# Patient Record
Sex: Male | Born: 1955 | Race: White | Hispanic: No | Marital: Married | Smoking: Never smoker
Health system: Southern US, Community
[De-identification: ages and names within clinical notes are randomized; demographics above are authoritative.]

## PROBLEM LIST (undated history)

## (undated) DIAGNOSIS — G7000 Myasthenia gravis without (acute) exacerbation: Secondary | ICD-10-CM

## (undated) HISTORY — PX: HERNIA REPAIR: SHX51

## (undated) HISTORY — PX: WISDOM TOOTH EXTRACTION: SHX21

## (undated) HISTORY — PX: KNEE SURGERY: SHX244

---

## 2009-07-24 ENCOUNTER — Ambulatory Visit: Payer: Self-pay | Admitting: Family Medicine

## 2009-07-24 DIAGNOSIS — L255 Unspecified contact dermatitis due to plants, except food: Secondary | ICD-10-CM

## 2009-07-25 ENCOUNTER — Telehealth (INDEPENDENT_AMBULATORY_CARE_PROVIDER_SITE_OTHER): Payer: Self-pay

## 2010-04-08 NOTE — Progress Notes (Signed)
  Phone Note Call from Patient   Caller: Patient Summary of Call: Patient called back and wanted to speak to the doctor about the medication dosage. Dr. Thurmond Butts told patient to take medicine one way and the actual prescription was written differently. Please contact the patient at [phone]. Nh  Initial call taken by: Dannette Barbara,  Jul 25, 2009 8:37 AM      Per physician review, pt advised to take medication as prescribed. Areta Haber CMA  Jul 25, 2009 11:42 AM

## 2010-04-08 NOTE — Assessment & Plan Note (Signed)
Summary: COUGH/KH   Vital Signs:  Patient Profile:   55 Years Old Male CC:      Poison Ivy on back x 2 days, Cough x 6 weeks Height:     66 inches Weight:      183 pounds O2 Sat:      98 % O2 treatment:    Room Air Temp:     97.0 degrees F oral Pulse rate:   65 / minute Pulse rhythm:   regular Resp:     16 per minute BP sitting:   115 / 77  (right arm) Cuff size:   large  Vitals Entered By: Emilio Math (Jul 24, 2009 12:03 PM)                  Prior Medication List:  No prior medications documented  Current Allergies: No known allergies History of Present Illness Chief Complaint: Poison Ivy on back x 2 days, Cough x 6 weeks History of Present Illness: Poisin ivy for 2 days. He reports when he gets its  its really bad.  He reports that when he gets it it can be various parts of his body. He has had the cough for 6-8 weeks.He does not relate it to position but reports dairy products make it worse.   Current Problems: CONTACT DERMATITIS&OTHER ECZEMA DUE TO PLANTS (ICD-692.6) COUGH (ICD-786.2)   Current Meds TUSSIONEX PENNKINETIC ER 8-10 MG/5ML LQCR (CHLORPHENIRAMINE-HYDROCODONE) sig  1 tsp by mouth twice a day prn for cough if affordable or covered by insurance RANITIDINE HCL 300 MG TABS (RANITIDINE HCL) 1 by mouth at bedtime * PREDNISONE DOSE PACK 10 MGS sig 6 tabletsx 2 days, 5 tablets day 3&4, 4 tablets day 5&6, 3 tablets day 7&8, 2 tablets day 9&10, 1 tablet day 11&12, 1/2 tablet day 13 and 14  REVIEW OF SYSTEMS Constitutional Symptoms      Denies fever, chills, night sweats, weight loss, weight gain, and fatigue.  Eyes       Denies change in vision, eye pain, eye discharge, glasses, contact lenses, and eye surgery. Ear/Nose/Throat/Mouth       Denies hearing loss/aids, change in hearing, ear pain, ear discharge, dizziness, frequent runny nose, frequent nose bleeds, sinus problems, sore throat, hoarseness, and tooth pain or bleeding.  Respiratory       Complains  of productive cough.      Denies dry cough, wheezing, shortness of breath, asthma, bronchitis, and emphysema/COPD.  Cardiovascular       Denies murmurs, chest pain, and tires easily with exhertion.    Gastrointestinal       Denies stomach pain, nausea/vomiting, diarrhea, constipation, blood in bowel movements, and indigestion. Genitourniary       Denies painful urination, kidney stones, and loss of urinary control. Neurological       Denies paralysis, seizures, and fainting/blackouts. Musculoskeletal       Denies muscle pain, joint pain, joint stiffness, decreased range of motion, redness, swelling, muscle weakness, and gout.  Skin       Denies bruising, unusual mles/lumps or sores, and hair/skin or nail changes.  Psych       Denies mood changes, temper/anger issues, anxiety/stress, speech problems, depression, and sleep problems.  Past History:  Family History: Last updated: 07/24/2009 Mother, D, Breast CA Father, D, Alzheimers  Social History: Last updated: 07/24/2009 Non-smoker ETOH-yes No Drugs retired  Past Medical History: Unremarkable  Past Surgical History: Left knee 1976, 1986  Family History: Reviewed history and no changes required.  Mother, D, Breast CA Father, D, Alzheimers  Social History: Reviewed history and no changes required. Non-smoker ETOH-yes No Drugs retired Physical Exam General appearance: well developed, well nourished, no acute distress Head: normocephalic, atraumatic Chest/Lungs: no rales, wheezes, or rhonchi bilateral, breath sounds equal without effort Heart: regular rate and  rhythm, no murmur Neurological: grossly intact and non-focal Skin: weeping and oozing rash over the R lower back  MSE: oriented to time, place, and person Assessment Problems:   New Problems: CONTACT DERMATITIS&OTHER ECZEMA DUE TO PLANTS (ICD-692.6) COUGH (ICD-786.2)  cough  poisin ivy    food allery versus reflux  Patient Education: Patient and/or  caregiver instructed in the following: rest fluids and Tylenol.  Plan New Medications/Changes: Sandria Senter ER 8-10 MG/5ML LQCR (CHLORPHENIRAMINE-HYDROCODONE) sig  1 tsp by mouth twice a day prn for cough if affordable or covered by insurance  #60fl oz x 0, 07/24/2009, Hassan Rowan MD PREDNISONE DOSE PACK 10 MGS sig 6 tabletsx 2 days, 5 tablets day 3&4, 4 tablets day 5&6, 3 tablets day 7&8, 2 tablets day 9&10, 1 tablet day 11&12, 1/2 tablet day 13 and 14  #QS x 0, 07/24/2009, Hassan Rowan MD RANITIDINE HCL 300 MG TABS (RANITIDINE HCL) 1 by mouth at bedtime  #30 x 0, 07/24/2009, Hassan Rowan MD  New Orders: New Patient Level IV [31517] Solumedrol up to 125mg  [J2930] T-Chest x-ray, 2 views [71020] Admin of Therapeutic Inj  intramuscular or subcutaneous [96372] Planning Comments:   see below  Follow Up: Follow up in 2-3 days if no improvement, Follow up with Primary Physician  The patient and/or caregiver has been counseled thoroughly with regard to medications prescribed including dosage, schedule, interactions, rationale for use, and possible side effects and they verbalize understanding.  Diagnoses and expected course of recovery discussed and will return if not improved as expected or if the condition worsens. Patient and/or caregiver verbalized understanding.  Prescriptions: TUSSIONEX PENNKINETIC ER 8-10 MG/5ML LQCR (CHLORPHENIRAMINE-HYDROCODONE) sig  1 tsp by mouth twice a day prn for cough if affordable or covered by insurance  #51fl oz x 0   Entered and Authorized by:   Hassan Rowan MD   Signed by:   Hassan Rowan MD on 07/24/2009   Method used:   Print then Give to Patient   RxID:   [id number] PREDNISONE DOSE PACK 10 MGS sig 6 tabletsx 2 days, 5 tablets day 3&4, 4 tablets day 5&6, 3 tablets day 7&8, 2 tablets day 9&10, 1 tablet day 11&12, 1/2 tablet day 13 and 14  #QS x 0   Entered and Authorized by:   Hassan Rowan MD   Signed by:   Hassan Rowan MD on 07/24/2009   Method used:    Print then Give to Patient   RxID:   [id number] RANITIDINE HCL 300 MG TABS (RANITIDINE HCL) 1 by mouth at bedtime  #30 x 0   Entered and Authorized by:   Hassan Rowan MD   Signed by:   Hassan Rowan MD on 07/24/2009   Method used:   Print then Give to Patient   RxID:   [id number]   Patient Instructions: 1)  Please schedule an appointment with your primary doctor in : 2)   schedule a follow-up appointment in 2 weeks if not better. 3)  Cough may be related to food allergy or position of sleep if reflux is the cause. Do not eat within 4 hrs of going to bed and sleep iw/head,neck and chestelevated to minimize reflux. 4)  If  food allergy or seasonal allergies the steroid use for the contact dermatitis should also help. 5)  Please schedule a follow-up appointment as needed.   Medication Administration  Injection # 1:    Medication: Solumedrol up to 125mg     Diagnosis: CONTACT DERMATITIS&OTHER ECZEMA DUE TO PLANTS (ICD-692.6)    Route: IM    Site: RUOQ gluteus    Exp Date: 01/08/2012    Lot #: [id number]    Mfr: Pharmacia    Given by: Emilio Math (Jul 24, 2009 12:53 PM)  Orders Added: 1)  New Patient Level IV [99204] 2)  Solumedrol up to 125mg  [J2930] 3)  T-Chest x-ray, 2 views [71020] 4)  Admin of Therapeutic Inj  intramuscular or subcutaneous [91478]

## 2010-04-28 ENCOUNTER — Ambulatory Visit (INDEPENDENT_AMBULATORY_CARE_PROVIDER_SITE_OTHER): Payer: BC Managed Care – PPO | Admitting: Emergency Medicine

## 2010-04-28 ENCOUNTER — Encounter: Payer: Self-pay | Admitting: Emergency Medicine

## 2010-04-28 DIAGNOSIS — L255 Unspecified contact dermatitis due to plants, except food: Secondary | ICD-10-CM

## 2010-05-06 NOTE — Assessment & Plan Note (Signed)
Summary: POSION IVY (rm 4)   Vital Signs:  Patient Profile:   55 Years Old Male CC:      poison ivy to both arms and face x 2 days Height:     66 inches Weight:      178 pounds O2 Sat:      96 % O2 treatment:    Room Air Temp:     99.0 degrees F oral Pulse rate:   72 / minute Resp:     14 per minute BP sitting:   103 / 68  (left arm) Cuff size:   regular  Vitals Entered By: Lajean Saver RN (April 28, 2010 10:42 AM)                  Updated Prior Medication List: No Medications Current Allergies: No known allergies History of Present Illness History from: patient Chief Complaint: poison ivy to both arms and face x 2 days History of Present Illness: Was clearing weeds and branches in his yard a few days ago and got in contact with vines.  Now is breaking out in a red itchy rash on both forearms.  Using OTC soaps to dry it out which is helping a bit.  He has used Mometasone in the past for poison ivy which works well.  No SOB.  REVIEW OF SYSTEMS Constitutional Symptoms      Denies fever, chills, night sweats, weight loss, weight gain, and fatigue.  Eyes       Denies change in vision, eye pain, eye discharge, glasses, contact lenses, and eye surgery. Ear/Nose/Throat/Mouth       Denies hearing loss/aids, change in hearing, ear pain, ear discharge, dizziness, frequent runny nose, frequent nose bleeds, sinus problems, sore throat, hoarseness, and tooth pain or bleeding.  Respiratory       Denies dry cough, productive cough, wheezing, shortness of breath, asthma, bronchitis, and emphysema/COPD.  Cardiovascular       Denies murmurs, chest pain, and tires easily with exhertion.    Gastrointestinal       Denies stomach pain, nausea/vomiting, diarrhea, constipation, blood in bowel movements, and indigestion. Genitourniary       Denies painful urination, kidney stones, and loss of urinary control. Neurological       Denies paralysis, seizures, and  fainting/blackouts. Musculoskeletal       Denies muscle pain, joint pain, joint stiffness, decreased range of motion, redness, swelling, muscle weakness, and gout.  Skin       Denies bruising, unusual mles/lumps or sores, and hair/skin or nail changes.  Psych       Denies mood changes, temper/anger issues, anxiety/stress, speech problems, depression, and sleep problems. Other Comments: ?poison ivy to both arms and left side of face since saturday. Leaking   Past History:  Past Medical History: Reviewed history from 07/24/2009 and no changes required. Unremarkable  Past Surgical History: Reviewed history from 07/24/2009 and no changes required. Left knee 1976, 1986  Family History: Reviewed history from 07/24/2009 and no changes required. Mother, D, Breast CA Father, D, Alzheimers  Social History: Reviewed history from 07/24/2009 and no changes required. Non-smoker ETOH-yes No Drugs retired Physical Exam General appearance: well developed, well nourished, no acute distress MSE: oriented to time, place, and person Scattered linear and raised excoriations and erythema both forearms, mildly to L side of face not affected the eye.  Patient Education: Patient and/or caregiver instructed in the following: rest, fluids.  Plan New Medications/Changes: HALOBETASOL PROPIONATE 0.05 % CREA (HALOBETASOL  PROPIONATE) apply to affected areas twice a day for a week  #50g x 0, 04/28/2010, Hoyt Koch MD  New Orders: Est. Patient Level III [phone] Solumedrol up to 125mg  [J2930] Admin of Therapeutic Inj  intramuscular or subcutaneous [96372] Planning Comments:   Cool showers.  Do not scratch.  Use the cream as directed.  Solumedrol shot given today as well.  Expect mild breakouts over the next few days but should gradually resolve.  Avoidance.  Follow-up with your primary care physician if not improving or if getting worse   The patient and/or caregiver has been counseled  thoroughly with regard to medications prescribed including dosage, schedule, interactions, rationale for use, and possible side effects and they verbalize understanding.  Diagnoses and expected course of recovery discussed and will return if not improved as expected or if the condition worsens. Patient and/or caregiver verbalized understanding.  Prescriptions: HALOBETASOL PROPIONATE 0.05 % CREA (HALOBETASOL PROPIONATE) apply to affected areas twice a day for a week  #50g x 0   Entered and Authorized by:   Hoyt Koch MD   Signed by:   Hoyt Koch MD on 04/28/2010   Method used:   Print then Give to Patient   RxID:   [id number]   Medication Administration  Injection # 1:    Medication: Solumedrol up to 125mg     Diagnosis: CONTACT DERMATITIS&OTHER ECZEMA DUE TO PLANTS (ICD-692.6)    Route: IM    Site: LUOQ gluteus    Exp Date: 08/07/2012    Lot #: [id number]    Mfr: pfizer    Patient tolerated injection without complications    Given by: Lajean Saver RN (April 28, 2010 11:12 AM)  Orders Added: 1)  Est. Patient Level III [95621] 2)  Solumedrol up to 125mg  [J2930] 3)  Admin of Therapeutic Inj  intramuscular or subcutaneous [30865]

## 2011-01-12 ENCOUNTER — Encounter: Payer: Self-pay | Admitting: *Deleted

## 2011-01-12 ENCOUNTER — Emergency Department (INDEPENDENT_AMBULATORY_CARE_PROVIDER_SITE_OTHER)
Admission: EM | Admit: 2011-01-12 | Discharge: 2011-01-12 | Disposition: A | Discharge status: Home / Self Care | Payer: BC Managed Care – PPO | Source: Home / Self Care | Attending: Family Medicine | Admitting: Family Medicine

## 2011-01-12 DIAGNOSIS — L02219 Cutaneous abscess of trunk, unspecified: Secondary | ICD-10-CM

## 2011-01-12 DIAGNOSIS — B029 Zoster without complications: Secondary | ICD-10-CM

## 2011-01-12 DIAGNOSIS — L03311 Cellulitis of abdominal wall: Secondary | ICD-10-CM

## 2011-01-12 MED ORDER — METHYLPREDNISOLONE ACETATE PF 80 MG/ML IJ SUSP
80.0000 mg | Freq: Once | INTRAMUSCULAR | Status: AC
  Administered 2011-01-12: 80 mg via INTRAMUSCULAR

## 2011-01-12 MED ORDER — CEPHALEXIN 500 MG PO CAPS: 500.0000 mg | ORAL_CAPSULE | Freq: Two times a day (BID) | ORAL | Status: AC

## 2011-01-12 MED ORDER — VALACYCLOVIR HCL 1 G PO TABS: 1000.0000 mg | ORAL_TABLET | Freq: Three times a day (TID) | ORAL | Status: AC

## 2011-01-12 MED ORDER — PREDNISONE 10 MG PO TABS: ORAL_TABLET | ORAL | Status: DC

## 2011-01-12 MED ORDER — DOMEBORO 25 % EX PACK: PACK | CUTANEOUS | Status: DC

## 2011-01-12 NOTE — ED Notes (Signed)
Pt c/o rash around his abdomen x 3-4 days. He states that it has been oozing and itching. He has tried OTC triple ABT oint and Zinc oxide.

## 2011-01-14 NOTE — ED Provider Notes (Signed)
History     CSN: [id number] Arrival date & time: 01/12/2011  8:15 AM   First MD Initiated Contact with Patient 01/12/11 [phone]      Chief Complaint  Patient presents with  . Rash    (Consider location/radiation/quality/duration/timing/severity/associated sxs/prior treatment) Patient is a 55 y.o. male presenting with rash. The history is provided by the patient.  Rash  The current episode started more than 1 week ago. The problem has been gradually worsening. The problem is associated with an unknown factor. There has been no fever. The rash is present on the torso. The patient is experiencing no pain. Associated symptoms include itching and weeping. He has tried antibiotic cream (zinc oxide ointment) for the symptoms.  The rash started on his left side and back, and then left abdomen.  The lesions on left abdomen have begun to ooze clear fluid.  History reviewed. No pertinent past medical history.  Past Surgical History  Procedure Date  . Knee surgery 1977/1984    LT knee  . Wisdom tooth extraction     Family History  Problem Relation Age of Onset  . Breast cancer Mother     History  Substance Use Topics  . Smoking status: Never Smoker   . Smokeless tobacco: Not on file  . Alcohol Use: No      Review of Systems  Constitutional: Negative.   HENT: Negative.   Eyes: Negative.   Respiratory: Negative.   Cardiovascular: Negative.   Gastrointestinal: Negative.   Genitourinary: Negative.   Musculoskeletal: Negative.   Skin: Positive for itching and rash.  Neurological: Negative.   Hematological: Negative for adenopathy.    Allergies  Review of patient's allergies indicates no known allergies.  Home Medications   Current Outpatient Rx  Name Route Sig Dispense Refill  . DOMEBORO 25 % EX PACK  Apply once daily to three times daily prn.  Dissolve in water as directed.  Use solution as a compress or wet dressing 12 each 0  . CEPHALEXIN 500 MG PO CAPS Oral Take 1  capsule (500 mg total) by mouth 2 times daily at 12 noon and 4 pm. 20 capsule 0  . PREDNISONE 10 MG PO TABS  Take 2 tabs twice daily for two days, then one tab twice daily for two days, then one daily for two days.  Take pc 14 tablet 0  . VALACYCLOVIR HCL 1 G PO TABS Oral Take 1 tablet (1,000 mg total) by mouth 3 (three) times daily. 21 tablet 0    Take for one week    BP 106/72  Pulse 71  Temp(Src) 98.4 F (36.9 C) (Oral)  Resp 18  Ht 5\' 6"  (1.676 m)  Wt 181 lb 8 oz (82.328 kg)  BMI 29.29 kg/m2  SpO2 96%  Physical Exam  Nursing note and vitals reviewed. Constitutional: He is oriented to person, place, and time. He appears well-developed and well-nourished. No distress.  HENT:  Nose: Nose normal.  Mouth/Throat: Oropharynx is clear and moist.  Eyes: Pupils are equal, round, and reactive to light.  Neck: Neck supple.  Cardiovascular: Regular rhythm and normal heart sounds.   Pulmonary/Chest: Effort normal and breath sounds normal. No respiratory distress. He has no wheezes. He has no rales.  Abdominal: Soft. There is no tenderness.  Lymphadenopathy:    He has no cervical adenopathy.  Neurological: He is alert and oriented to person, place, and time.  Skin: Skin is warm and dry. Rash noted.  On the left lateral chest and thoracic back are numerous erythematous herpetic appearing lesions.  On the left abdomen is an round area of confluent mild erythema about 12cm dia with a moist central area.  No purulent drainage.  On the right abdomen are a number of small 2 to 3mm non-specific macules that are not particularly herpetic in appearance.  Psychiatric: He has a normal mood and affect.    ED Course  Procedures :  None   Labs Reviewed  WOUND CULTURE   Narrative:    Performed at:  [facility]                [address]                [place], [place] [address]      1. Herpes zoster   2. Cellulitis, abdominal wall       MDM    Suspect initial left thoracic and abdominal herpes zoster with onset of secondary superficial cellulitis left abdomen. Will obtain wound culture.  Begin empiric Keflex.  Begin Valtrex.  Recommend Domeboro compresses to weeping lesions.  Followup with dermatologist if not improving, or if develops fever, pain, etc.  Administered Depo Medrol 80mg .  Tomorrow begin tapering course of prednisone       Donna Christen, MD 01/14/11 1322

## 2011-01-16 ENCOUNTER — Telehealth: Payer: Self-pay | Admitting: *Deleted

## 2011-01-16 LAB — WOUND CULTURE
Gram Stain: NONE SEEN
Gram Stain: NONE SEEN

## 2012-12-15 ENCOUNTER — Ambulatory Visit (HOSPITAL_COMMUNITY): Payer: Self-pay | Admitting: Behavioral Health

## 2012-12-23 ENCOUNTER — Encounter: Payer: Self-pay | Admitting: Internal Medicine

## 2013-03-15 ENCOUNTER — Encounter: Payer: Self-pay | Admitting: Internal Medicine

## 2013-03-17 ENCOUNTER — Ambulatory Visit (AMBULATORY_SURGERY_CENTER): Payer: Self-pay | Admitting: *Deleted

## 2013-03-17 VITALS — Ht 67.0 in | Wt 189.0 lb

## 2013-03-17 DIAGNOSIS — Z1211 Encounter for screening for malignant neoplasm of colon: Secondary | ICD-10-CM

## 2013-03-17 MED ORDER — NA SULFATE-K SULFATE-MG SULF 17.5-3.13-1.6 GM/177ML PO SOLN: ORAL | Status: DC

## 2013-03-17 NOTE — Progress Notes (Signed)
No egg or soy allergy 

## 2013-03-20 ENCOUNTER — Encounter: Payer: Self-pay | Admitting: Gastroenterology

## 2013-03-31 ENCOUNTER — Ambulatory Visit (AMBULATORY_SURGERY_CENTER): Payer: BC Managed Care – PPO | Admitting: Gastroenterology

## 2013-03-31 ENCOUNTER — Encounter: Payer: Self-pay | Admitting: Gastroenterology

## 2013-03-31 VITALS — BP 118/75 | HR 61 | Temp 98.2°F | Resp 16 | Ht 67.0 in | Wt 189.0 lb

## 2013-03-31 DIAGNOSIS — Z1211 Encounter for screening for malignant neoplasm of colon: Secondary | ICD-10-CM

## 2013-03-31 MED ORDER — SODIUM CHLORIDE 0.9 % IV SOLN: 500.0000 mL | INTRAVENOUS | Status: DC

## 2013-03-31 NOTE — Op Note (Signed)
[facility] [address] [place], [id number]   COLONOSCOPY PROCEDURE REPORT  PATIENT: Phillip Roberts, Phillip Roberts  MR#: [id number] BIRTHDATE: 07/29/1955 , 57  yrs. old GENDER: Male ENDOSCOPIST: Louis Meckelobert D Esai Stecklein, MD REFERRED BY: PROCEDURE DATE:  03/31/2013 PROCEDURE:   Colonoscopy, diagnostic First Screening Colonoscopy - Avg.  risk and is 50 yrs.  old or older Yes.  Prior Negative Screening - Now for repeat screening. N/A  History of Adenoma - Now for follow-up colonoscopy & has been > or = to 3 yrs.  N/A  Polyps Removed Today? No.  Recommend repeat exam, <10 yrs? No. ASA CLASS:   Class I INDICATIONS:Average risk patient for colon cancer and average risk screening. MEDICATIONS: MAC sedation, administered by CRNA and propofol (Diprivan) 250mg  IV  DESCRIPTION OF PROCEDURE:   After the risks benefits and alternatives of the procedure were thoroughly explained, informed consent was obtained.  A digital rectal exam revealed no abnormalities of the rectum.   The LB [id number]-[id number]  endoscope was introduced through the anus and advanced to the cecum, which was identified by both the appendix and ileocecal valve. No adverse events experienced.   The quality of the prep was excellent using Suprep  The instrument was then slowly withdrawn as the colon was fully examined.      COLON FINDINGS: A normal appearing cecum, ileocecal valve, and appendiceal orifice were identified.  The ascending, hepatic flexure, transverse, splenic flexure, descending, sigmoid colon and rectum appeared unremarkable.  No polyps or cancers were seen. Retroflexed views revealed no abnormalities. The time to cecum=2 minutes 20 seconds.  Withdrawal time=9 minutes 0 seconds.  The scope was withdrawn and the procedure completed. COMPLICATIONS: There were no complications.  ENDOSCOPIC IMPRESSION: Normal colon  RECOMMENDATIONS: Continue current colorectal screening recommendations for "routine risk"  patients with a repeat colonoscopy in 10 years.   eSigned:  Louis Meckelobert D Johnelle Tafolla, MD 03/31/2013 9:36 AM   cc: Cathrine MusterIvey Holly, MD   PATIENT NAME:  Phillip Roberts, Phillip Roberts MR#: [id number]

## 2013-03-31 NOTE — Patient Instructions (Signed)
Impressions/recommendations:  Normal colonoscopy  Repeat colonoscopy in 10 years.  YOU HAD AN ENDOSCOPIC PROCEDURE TODAY AT THE [facility]: Refer to the procedure report that was given to you for any specific questions about what was found during the examination.  If the procedure report does not answer your questions, please call your gastroenterologist to clarify.  If you requested that your care partner not be given the details of your procedure findings, then the procedure report has been included in a sealed envelope for you to review at your convenience later.  YOU SHOULD EXPECT: Some feelings of bloating in the abdomen. Passage of more gas than usual.  Walking can help get rid of the air that was put into your GI tract during the procedure and reduce the bloating. If you had a lower endoscopy (such as a colonoscopy or flexible sigmoidoscopy) you may notice spotting of blood in your stool or on the toilet paper. If you underwent a bowel prep for your procedure, then you may not have a normal bowel movement for a few days.  DIET: Your first meal following the procedure should be a light meal and then it is ok to progress to your normal diet.  A half-sandwich or bowl of soup is an example of a good first meal.  Heavy or fried foods are harder to digest and may make you feel nauseous or bloated.  Likewise meals heavy in dairy and vegetables can cause extra gas to form and this can also increase the bloating.  Drink plenty of fluids but you should avoid alcoholic beverages for 24 hours.  ACTIVITY: Your care partner should take you home directly after the procedure.  You should plan to take it easy, moving slowly for the rest of the day.  You can resume normal activity the day after the procedure however you should NOT DRIVE or use heavy machinery for 24 hours (because of the sedation medicines used during the test).    SYMPTOMS TO REPORT IMMEDIATELY: A gastroenterologist can be reached  at any hour.  During normal business hours, 8:30 AM to 5:00 PM Monday through Friday, call [phone].  After hours and on weekends, please call the GI answering service at [phone] who will take a message and have the physician on call contact you.   Following lower endoscopy (colonoscopy or flexible sigmoidoscopy):  Excessive amounts of blood in the stool  Significant tenderness or worsening of abdominal pains  Swelling of the abdomen that is new, acute  Fever of 100F or higher   FOLLOW UP: If any biopsies were taken you will be contacted by phone or by letter within the next 1-3 weeks.  Call your gastroenterologist if you have not heard about the biopsies in 3 weeks.  Our staff will call the home number listed on your records the next business day following your procedure to check on you and address any questions or concerns that you may have at that time regarding the information given to you following your procedure. This is a courtesy call and so if there is no answer at the home number and we have not heard from you through the emergency physician on call, we will assume that you have returned to your regular daily activities without incident.  SIGNATURES/CONFIDENTIALITY: You and/or your care partner have signed paperwork which will be entered into your electronic medical record.  These signatures attest to the fact that that the information above on your After Visit Summary has been   reviewed and is understood.  Full responsibility of the confidentiality of this discharge information lies with you and/or your care-partner. 

## 2013-04-03 ENCOUNTER — Telehealth: Payer: Self-pay | Admitting: *Deleted

## 2013-04-03 NOTE — Telephone Encounter (Signed)
Called pt, pt answered and quickly said he would call back and then hung up

## 2015-08-19 ENCOUNTER — Ambulatory Visit (INDEPENDENT_AMBULATORY_CARE_PROVIDER_SITE_OTHER): Payer: BLUE CROSS/BLUE SHIELD

## 2015-08-19 ENCOUNTER — Ambulatory Visit (INDEPENDENT_AMBULATORY_CARE_PROVIDER_SITE_OTHER): Payer: BLUE CROSS/BLUE SHIELD | Admitting: Family Medicine

## 2015-08-19 ENCOUNTER — Encounter: Payer: Self-pay | Admitting: Family Medicine

## 2015-08-19 VITALS — BP 119/77 | HR 76 | Wt 200.0 lb

## 2015-08-19 DIAGNOSIS — M25562 Pain in left knee: Secondary | ICD-10-CM | POA: Diagnosis not present

## 2015-08-19 DIAGNOSIS — M25561 Pain in right knee: Secondary | ICD-10-CM | POA: Diagnosis not present

## 2015-08-19 NOTE — Patient Instructions (Signed)
Thank you for coming in today. Return as needed.   Arthritis Arthritis is a term that is commonly used to refer to joint pain or joint disease. There are more than 100 types of arthritis. CAUSES The most common cause of this condition is wear and tear of a joint. Other causes include:  Gout.  Inflammation of a joint.  An infection of a joint.  Sprains and other injuries near the joint.  A drug reaction or allergic reaction. In some cases, the cause may not be known. SYMPTOMS The main symptom of this condition is pain in the joint with movement. Other symptoms include:  Redness, swelling, or stiffness at a joint.  Warmth coming from the joint.  Fever.  Overall feeling of illness. DIAGNOSIS This condition may be diagnosed with a physical exam and tests, including:  Blood tests.  Urine tests.  Imaging tests, such as MRI, X-rays, or a CT scan. Sometimes, fluid is removed from a joint for testing. TREATMENT Treatment for this condition may involve:  Treatment of the cause, if it is known.  Rest.  Raising (elevating) the joint.  Applying cold or hot packs to the joint.  Medicines to improve symptoms and reduce inflammation.  Injections of a steroid such as cortisone into the joint to help reduce pain and inflammation. Depending on the cause of your arthritis, you may need to make lifestyle changes to reduce stress on your joint. These changes may include exercising more and losing weight. HOME CARE INSTRUCTIONS Medicines  Take over-the-counter and prescription medicines only as told by your health care provider.  Do not take aspirin to relieve pain if gout is suspected. Activities  Rest your joint if told by your health care provider. Rest is important when your disease is active and your joint feels painful, swollen, or stiff.  Avoid activities that make the pain worse. It is important to balance activity with rest.  Exercise your joint regularly with  range-of-motion exercises as told by your health care provider. Try doing low-impact exercise, such as:  Swimming.  Water aerobics.  Biking.  Walking. Joint Care  If your joint is swollen, keep it elevated if told by your health care provider.  If your joint feels stiff in the morning, try taking a warm shower.  If directed, apply heat to the joint. If you have diabetes, do not apply heat without permission from your health care provider.  Put a towel between the joint and the hot pack or heating pad.  Leave the heat on the area for 20-30 minutes.  If directed, apply ice to the joint:  Put ice in a plastic bag.  Place a towel between your skin and the bag.  Leave the ice on for 20 minutes, 2-3 times per day.  Keep all follow-up visits as told by your health care provider. This is important. SEEK MEDICAL CARE IF:  The pain gets worse.  You have a fever. SEEK IMMEDIATE MEDICAL CARE IF:  You develop severe joint pain, swelling, or redness.  Many joints become painful and swollen.  You develop severe back pain.  You develop severe weakness in your leg.  You cannot control your bladder or bowels.   This information is not intended to replace advice given to you by your health care provider. Make sure you discuss any questions you have with your health care provider.   Document Released: 04/02/2004 Document Revised: 11/14/2014 Document Reviewed: 05/21/2014 Elsevier Interactive Patient Education [employer]2016 Elsevier Inc.

## 2015-08-19 NOTE — Progress Notes (Signed)
.           Phillip Roberts is a 60 y.o. male who [facility] to [facility] today for left knee pain. Patient has several years of left medial knee pain. Pain is worse with activity and better with rest. He notes swelling locking and catching and occasional giving way. He's not had much of an evaluation recently. He notes he had a knee surgery 1977 that sounds like a total medial meniscectomy and some other arthroscopic surgery in 1980s. He denies any fevers or chills nausea vomiting or diarrhea. He does note the knee symptoms to limit his activity and are interfering with his ability to exercise. He tried some over-the-counter medicines to help a bit.   No past medical history on file. Past Surgical History  Procedure Laterality Date  . Knee surgery  1977/1984    LT knee  . Wisdom tooth extraction     Social History  Substance Use Topics  . Smoking status: Never Smoker   . Smokeless tobacco: Never Used  . Alcohol Use: Yes     Comment: rarely    family history includes Breast cancer in his mother. There is no history of Colon cancer, Esophageal cancer, Stomach cancer, or Rectal cancer.  ROS:  No headache, visual changes, nausea, vomiting, diarrhea, constipation, dizziness, abdominal pain, skin rash, fevers, chills, night sweats, weight loss, swollen lymph nodes, body aches, joint swelling, muscle aches, chest pain, shortness of breath, mood changes, visual or auditory hallucinations.    Medications: Current Outpatient Prescriptions  Medication Sig Dispense Refill  . fluticasone (FLONASE) 50 MCG/ACT nasal spray     . Multiple Vitamin (THERA) TABS Take by mouth.     No current facility-administered medications for this visit.   No Known Allergies   Exam:  BP 119/77 mmHg  Pulse 76  Wt 200 lb (90.719 kg) General: Well Developed, well nourished, and in no acute distress.  Neuro/Psych: Alert and oriented x3, extra-ocular muscles intact, able to  move all 4 extremities, sensation grossly intact. Skin: Warm and dry, no rashes noted.  Respiratory: Not using accessory muscles, speaking in full sentences, trachea midline.  Cardiovascular: Pulses palpable, no extremity edema. Abdomen: Does not appear distended. MSK: Right knee no effusion non-erythematous. Well-appearing scar on the medial knee. Range of motion 0-120. Nontender. Lax anterior drawer test normal posterior drawer test Negative valgus and varus stress test Negative McMurray's test Knee strength is intact. Normal gait.  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 No acute fracture. Awaiting formal radiology review   No results found for this or any previous visit (from the past 24 hour(s)). No results found.   60 year old male with mild left knee pain. Patient's pain is almost certainly due to significant DJD. His symptoms are moderate at this time. He does have some mechanical symptoms likely due to loose bodies. We discussed options. Patient will elect for watchful waiting. He will return as needed for injections.

## 2018-04-12 ENCOUNTER — Telehealth: Payer: Self-pay | Admitting: Internal Medicine

## 2018-04-12 NOTE — Telephone Encounter (Signed)
ok 

## 2018-04-13 NOTE — Telephone Encounter (Signed)
LM w/wife to call back to schedule

## 2021-08-29 ENCOUNTER — Ambulatory Visit (INDEPENDENT_AMBULATORY_CARE_PROVIDER_SITE_OTHER): Payer: Medicare (Managed Care) | Admitting: Podiatry

## 2021-08-29 ENCOUNTER — Encounter: Payer: Self-pay | Admitting: Podiatry

## 2021-08-29 ENCOUNTER — Ambulatory Visit (INDEPENDENT_AMBULATORY_CARE_PROVIDER_SITE_OTHER): Payer: Medicare (Managed Care)

## 2021-08-29 DIAGNOSIS — M7731 Calcaneal spur, right foot: Secondary | ICD-10-CM

## 2021-08-29 DIAGNOSIS — G8929 Other chronic pain: Secondary | ICD-10-CM | POA: Diagnosis not present

## 2021-08-29 DIAGNOSIS — M79671 Pain in right foot: Secondary | ICD-10-CM

## 2021-08-29 DIAGNOSIS — M722 Plantar fascial fibromatosis: Secondary | ICD-10-CM

## 2021-08-29 MED ORDER — MELOXICAM 15 MG PO TABS: 15.0000 mg | ORAL_TABLET | Freq: Every day | ORAL | 0 refills | Status: DC

## 2021-10-09 ENCOUNTER — Encounter: Payer: Self-pay | Admitting: Podiatry

## 2021-10-09 ENCOUNTER — Ambulatory Visit (INDEPENDENT_AMBULATORY_CARE_PROVIDER_SITE_OTHER): Payer: Medicare (Managed Care) | Admitting: Podiatry

## 2021-10-09 DIAGNOSIS — M722 Plantar fascial fibromatosis: Secondary | ICD-10-CM

## 2021-10-09 NOTE — Progress Notes (Signed)
  Subjective:  Patient ID: Phillip Roberts, male    DOB: [birth year],   MRN: [id number]  Chief Complaint  Patient presents with   Plantar Fasciitis    6 weeks f/u  patient states foot is much better with no constant pain    66 y.o. male presents for follow-up of right plantar fasciitis. .Relates it is doing much better. Relates 90% better. States he took the meloxicam and has been doing stretching 5 times a day. Also relates he has been wearing shoes around house. Only time he has had some pain is after golf. Added support to golf shoes to help with this.  Denies any other pedal complaints. Denies n/v/f/c.   History reviewed. No pertinent past medical history.  Objective:  Physical Exam: Vascular: DP/PT pulses 2/4 bilateral. CFT <3 seconds. Normal hair growth on digits. No edema.  Skin. No lacerations or abrasions bilateral feet.  Musculoskeletal: MMT 5/5 bilateral lower extremities in DF, PF, Inversion and Eversion. Deceased ROM in DF of ankle joint. No pain to medial calcaneal tubercle No pain to achilles. . No pain along PT, arch or with calcaneal squeeze.  Neurological: Sensation intact to light touch.   Assessment:   1. Plantar fasciitis, right       Plan:  Patient was evaluated and treated and all questions answered. Discussed plantar fasciitis with patient.  X-rays reviewed and discussed with patient. No acute fractures or dislocations noted. Mild spurring noted at inferior calcaneus.  Discussed treatment options including, ice, NSAIDS, supportive shoes, bracing, and stretching.  Continue stretching and support.  Anti-inflammatories as needed.  Follow-up as needed.    Louann Sjogren, DPM

## 2021-10-10 ENCOUNTER — Ambulatory Visit: Payer: Medicare (Managed Care) | Admitting: Podiatry

## 2021-12-05 ENCOUNTER — Encounter: Payer: Self-pay | Admitting: Emergency Medicine

## 2021-12-05 ENCOUNTER — Ambulatory Visit (INDEPENDENT_AMBULATORY_CARE_PROVIDER_SITE_OTHER): Payer: Medicare (Managed Care)

## 2021-12-05 ENCOUNTER — Other Ambulatory Visit: Payer: Self-pay

## 2021-12-05 ENCOUNTER — Ambulatory Visit
Admission: EM | Admit: 2021-12-05 | Discharge: 2021-12-05 | Disposition: A | Discharge status: Home / Self Care | Payer: Medicare (Managed Care)

## 2021-12-05 DIAGNOSIS — S61227A Laceration with foreign body of left little finger without damage to nail, initial encounter: Secondary | ICD-10-CM

## 2021-12-05 DIAGNOSIS — S62667B Nondisplaced fracture of distal phalanx of left little finger, initial encounter for open fracture: Secondary | ICD-10-CM | POA: Diagnosis not present

## 2021-12-05 DIAGNOSIS — Z23 Encounter for immunization: Secondary | ICD-10-CM

## 2021-12-05 DIAGNOSIS — G7000 Myasthenia gravis without (acute) exacerbation: Secondary | ICD-10-CM

## 2021-12-05 DIAGNOSIS — S6992XA Unspecified injury of left wrist, hand and finger(s), initial encounter: Secondary | ICD-10-CM | POA: Diagnosis not present

## 2021-12-05 HISTORY — DX: Myasthenia gravis without (acute) exacerbation: G70.00

## 2021-12-05 MED ORDER — TETANUS-DIPHTH-ACELL PERTUSSIS 5-2.5-18.5 LF-MCG/0.5 IM SUSY
0.5000 mL | PREFILLED_SYRINGE | Freq: Once | INTRAMUSCULAR | Status: AC
  Administered 2021-12-05: 0.5 mL via INTRAMUSCULAR

## 2021-12-05 MED ORDER — DOXYCYCLINE HYCLATE 100 MG PO CAPS: 100.0000 mg | ORAL_CAPSULE | Freq: Two times a day (BID) | ORAL | 0 refills | Status: AC

## 2021-12-05 NOTE — Discharge Instructions (Addendum)
Return if any problems.

## 2021-12-05 NOTE — ED Triage Notes (Signed)
Left 5th finger cut with hedge trimmer this morning. TDAP Unknown

## 2021-12-05 NOTE — ED Provider Notes (Signed)
[facility]    CSN: [id number] Arrival date & time: 12/05/21  1039      History   Chief Complaint Chief Complaint  Patient presents with   Extremity Laceration    HPI Phillip Roberts is a 66 y.o. male.   Pt reports he was using a hedge clipper and cut the tip of his left 5th finger.  Pt reports he thinks he cut into the bone.  He has a past histroy of myasthenia gravis.  Pt last tetanus shot was 2016.      Past Medical History:  Diagnosis Date   Myasthenia gravis [facility]     Patient Active Problem List   Diagnosis Date Noted   Myasthenia gravis ([place]) 12/05/2021   Left knee pain 08/19/2015    Past Surgical History:  Procedure Laterality Date   KNEE SURGERY  1977/1984   LT knee   WISDOM TOOTH EXTRACTION         Home Medications    Prior to Admission medications   Medication Sig Start Date End Date Taking? Authorizing Provider  doxycycline (VIBRAMYCIN) 100 MG capsule Take 1 capsule (100 mg total) by mouth 2 (two) times daily. 12/05/21  Yes Fransico Meadow, PA-C  mycophenolate (CELLCEPT) 500 MG tablet Take by mouth 2 (two) times daily.   Yes [provider]  Vitamin D, Ergocalciferol, (DRISDOL) 1.25 MG (50000 UNIT) CAPS capsule Take 50,000 Units by mouth every 7 (seven) days.   Yes [provider]    Family History Family History  Problem Relation Age of Onset   Breast cancer Mother    Colon cancer Neg Hx    Esophageal cancer Neg Hx    Stomach cancer Neg Hx    Rectal cancer Neg Hx     Social History Social History   Tobacco Use   Smoking status: Never   Smokeless tobacco: Never  Vaping Use   Vaping Use: Never used  Substance Use Topics   Alcohol use: Yes    Comment: rarely    Drug use: No     Allergies   Patient has no known allergies.   Review of Systems Review of Systems  All other systems reviewed and are negative.    Physical Exam Triage Vital Signs ED Triage Vitals  Enc Vitals Group     BP 12/05/21  1057 118/75     Pulse Rate 12/05/21 1057 69     Resp 12/05/21 1057 16     Temp 12/05/21 1057 97.9 F (36.6 C)     Temp Source 12/05/21 1057 Oral     SpO2 12/05/21 1057 95 %     Weight 12/05/21 1059 182 lb (82.6 kg)     Height 12/05/21 1059 5\' 6"  (1.676 m)     Head Circumference --      Peak Flow --      Pain Score 12/05/21 1058 4     Pain Loc --      Pain Edu? --      Excl. in [place]? --    No data found.  Updated Vital Signs BP 118/75 (BP Location: Right Arm)   Pulse 69   Temp 97.9 F (36.6 C) (Oral)   Resp 16   Ht 5\' 6"  (1.676 m)   Wt 82.6 kg   SpO2 95%   BMI 29.38 kg/m   Visual Acuity Right Eye Distance:   Left Eye Distance:   Bilateral Distance:    Right Eye Near:   Left Eye  Near:    Bilateral Near:     Physical Exam Vitals and nursing note reviewed.  Constitutional:      Appearance: He is well-developed.  HENT:     Head: Normocephalic.  Cardiovascular:     Rate and Rhythm: Normal rate.  Pulmonary:     Effort: Pulmonary effort is normal.  Abdominal:     General: There is no distension.  Musculoskeletal:        General: Normal range of motion.     Cervical back: Normal range of motion.  Skin:    General: Skin is warm.     Comments: Laceration distal tip of 5th finger palmar side, gapping  from,  good color, decreased sensation   Neurological:     General: No focal deficit present.     Mental Status: He is alert and oriented to person, place, and time.      UC Treatments / Results  Labs (all labs ordered are listed, but only abnormal results are displayed) Labs Reviewed - No data to display  EKG   Radiology DG Finger Little Left  Result Date: 12/05/2021 CLINICAL DATA:  Trauma EXAM: LEFT LITTLE FINGER 2+V COMPARISON:  None Available. FINDINGS: There is transverse fracture in the tip of distal phalanx. There is minimal palmar displacement of distal fracture fragment. There is 1-2 mm distraction of fracture fragments. The soft tissue laceration  along the palmar aspect of the tip of fifth finger. Small bony spurs seen in the distal interphalangeal joint. IMPRESSION: Slightly displaced fracture is noted in the tip of distal phalanx of left fifth finger. Electronically Signed   By: Ernie Avena M.D.   On: 12/05/2021 11:54    Procedures Laceration Repair  Date/Time: 12/05/2021 12:33 PM  Performed by: Elson Areas, PA-C Authorized by: Elson Areas, PA-C   Consent:    Consent obtained:  Verbal   Consent given by:  Patient   Risks, benefits, and alternatives were discussed: yes     Risks discussed:  Infection and pain   Alternatives discussed:  No treatment Universal protocol:    Procedure explained and questions answered to patient or proxy's satisfaction: no     Imaging studies available: yes     Immediately prior to procedure, a time out was called: yes     Patient identity confirmed:  Verbally with patient Anesthesia:    Anesthesia method:  Nerve block   Block needle gauge:  27 G   Block anesthetic:  Bupivacaine 0.25% w/o epi   Block technique:  Digital   Block injection procedure:  Anatomic landmarks identified Laceration details:    Location:  Finger   Finger location:  L small finger   Length (cm):  1.2 Pre-procedure details:    Preparation:  Patient was prepped and draped in usual sterile fashion Exploration:    Contaminated: no   Treatment:    Area cleansed with:  Povidone-iodine   Amount of cleaning:  Standard   Irrigation solution:  Sterile water Skin repair:    Repair method:  Sutures   Suture size:  5-0   Suture material:  Prolene   Suture technique:  Simple interrupted   Number of sutures:  4 Approximation:    Approximation:  Loose Repair type:    Repair type:  Intermediate Post-procedure details:    Procedure completion:  Tolerated  (including critical care time)  Medications Ordered in UC Medications  Tdap (BOOSTRIX) injection 0.5 mL (has no administration in time range)  Initial Impression / Assessment and Plan / UC Course  I have reviewed the triage vital signs and the nursing notes.  Pertinent labs & imaging results that were available during my care of the patient were reviewed by me and considered in my medical decision making (see chart for details).     Pt has an open fracture.  Pt given rx for antibiotics and referred to Hand.  Pt prefers to see Dr. Amanda Pea.   Final Clinical Impressions(s) / UC Diagnoses   Final diagnoses:  Laceration of left little finger with foreign body, nail damage status unspecified, initial encounter  Open nondisplaced fracture of distal phalanx of left little finger, initial encounter     Discharge Instructions      Return if any problems.     ED Prescriptions     Medication Sig Dispense Auth. Provider   doxycycline (VIBRAMYCIN) 100 MG capsule Take 1 capsule (100 mg total) by mouth 2 (two) times daily. 20 capsule Elson Areas, [place]      PDMP not reviewed this encounter.   Elson Areas, [place] 12/05/21 1235

## 2021-12-06 ENCOUNTER — Ambulatory Visit
Admission: EM | Admit: 2021-12-06 | Discharge: 2021-12-06 | Disposition: A | Discharge status: Home / Self Care | Payer: Medicare (Managed Care) | Attending: Family Medicine | Admitting: Family Medicine

## 2021-12-06 ENCOUNTER — Encounter: Payer: Self-pay | Admitting: Emergency Medicine

## 2021-12-06 DIAGNOSIS — Z5189 Encounter for other specified aftercare: Secondary | ICD-10-CM

## 2021-12-06 NOTE — ED Provider Notes (Signed)
[facility]    CSN: [id number] Arrival date & time: 12/06/21  1029      History   Chief Complaint Chief Complaint  Patient presents with   Laceration    HPI Phillip Roberts is a 66 y.o. male.   HPI  Patient is having some bleeding from his wound and would like to have it checked.  It was sutured yesterday  Past Medical History:  Diagnosis Date   Myasthenia gravis [facility])     Patient Active Problem List   Diagnosis Date Noted   Myasthenia gravis ([facility]) 12/05/2021   Left knee pain 08/19/2015    Past Surgical History:  Procedure Laterality Date   KNEE SURGERY  1977/1984   LT knee   WISDOM TOOTH EXTRACTION         Home Medications    Prior to Admission medications   Medication Sig Start Date End Date Taking? Authorizing Provider  doxycycline (VIBRAMYCIN) 100 MG capsule Take 1 capsule (100 mg total) by mouth 2 (two) times daily. 12/05/21  Yes Elson Areas, PA-C  mycophenolate (CELLCEPT) 500 MG tablet Take by mouth 2 (two) times daily.   Yes [provider]  Vitamin D, Ergocalciferol, (DRISDOL) 1.25 MG (50000 UNIT) CAPS capsule Take 50,000 Units by mouth every 7 (seven) days.   Yes [provider]    Family History Family History  Problem Relation Age of Onset   Breast cancer Mother    Colon cancer Neg Hx    Esophageal cancer Neg Hx    Stomach cancer Neg Hx    Rectal cancer Neg Hx     Social History Social History   Tobacco Use   Smoking status: Never   Smokeless tobacco: Never  Vaping Use   Vaping Use: Never used  Substance Use Topics   Alcohol use: Yes    Comment: rarely    Drug use: No     Allergies   Patient has no known allergies.   Review of Systems Review of Systems See HPI  Physical Exam Triage Vital Signs ED Triage Vitals  Enc Vitals Group     BP 12/06/21 1205 134/78     Pulse Rate 12/06/21 1205 (!) 56     Resp 12/06/21 1205 18     Temp 12/06/21 1205 98.1 F (36.7 C)     Temp Source 12/06/21  1205 Oral     SpO2 12/06/21 1205 98 %     Weight --      Height --      Head Circumference --      Peak Flow --      Pain Score 12/06/21 1231 0     Pain Loc --      Pain Edu? --      Excl. in [place]? --    No data found.  Updated Vital Signs BP 134/78 (BP Location: Left Arm)   Pulse (!) 56   Temp 98.1 F (36.7 C) (Oral)   Resp 18   SpO2 98%     Physical Exam Constitutional:      General: He is not in acute distress.    Appearance: He is well-developed.  HENT:     Head: Normocephalic and atraumatic.  Eyes:     Conjunctiva/sclera: Conjunctivae normal.     Pupils: Pupils are equal, round, and reactive to light.  Cardiovascular:     Rate and Rhythm: Normal rate.  Pulmonary:     Effort: Pulmonary effort is normal. No respiratory distress.  Abdominal:     General: There is no distension.     Palpations: Abdomen is soft.  Musculoskeletal:        General: Normal range of motion.     Cervical back: Normal range of motion.  Skin:    General: Skin is warm and dry.     Comments: Laceration of fingertip is examined.  The sutures are holding the tip in place with loose approximation.  It is cleaned.  There is no active bleeding.  The tip is somewhat pale but not dusky.  Discussed wound care  Neurological:     Mental Status: He is alert.      UC Treatments / Results  Labs (all labs ordered are listed, but only abnormal results are displayed) Labs Reviewed - No data to display  EKG   Radiology DG Finger Little Left  Result Date: 12/05/2021 CLINICAL DATA:  Trauma EXAM: LEFT LITTLE FINGER 2+V COMPARISON:  None Available. FINDINGS: There is transverse fracture in the tip of distal phalanx. There is minimal palmar displacement of distal fracture fragment. There is 1-2 mm distraction of fracture fragments. The soft tissue laceration along the palmar aspect of the tip of fifth finger. Small bony spurs seen in the distal interphalangeal joint. IMPRESSION: Slightly displaced fracture  is noted in the tip of distal phalanx of left fifth finger. Electronically Signed   By: Elmer Picker M.D.   On: 12/05/2021 11:54    Procedures Procedures (including critical care time)  Medications Ordered in UC Medications - No data to display  Initial Impression / Assessment and Plan / UC Course  I have reviewed the triage vital signs and the nursing notes.  Pertinent labs & imaging results that were available during my care of the patient were reviewed by me and considered in my medical decision making (see chart for details).     Final Clinical Impressions(s) / UC Diagnoses   Final diagnoses:  Visit for wound check     Discharge Instructions      Go to the orthopedic urgent care on Friday Call or return sooner for problems    ED Prescriptions   None    PDMP not reviewed this encounter.   Raylene Everts, MD 12/06/21 1346

## 2021-12-06 NOTE — Discharge Instructions (Signed)
Go to the orthopedic urgent care on Friday Call or return sooner for problems

## 2021-12-06 NOTE — ED Triage Notes (Signed)
Patient was seen yesterday for a left 5th finger laceration.  Patient noticed that his stitches from yesterday came loose.  Patient here for a recheck.

## 2022-04-06 DIAGNOSIS — L579 Skin changes due to chronic exposure to nonionizing radiation, unspecified: Secondary | ICD-10-CM | POA: Diagnosis not present

## 2022-04-06 DIAGNOSIS — D235 Other benign neoplasm of skin of trunk: Secondary | ICD-10-CM | POA: Diagnosis not present

## 2022-04-06 DIAGNOSIS — L814 Other melanin hyperpigmentation: Secondary | ICD-10-CM | POA: Diagnosis not present

## 2022-04-06 DIAGNOSIS — D485 Neoplasm of uncertain behavior of skin: Secondary | ICD-10-CM | POA: Diagnosis not present

## 2022-04-06 DIAGNOSIS — D225 Melanocytic nevi of trunk: Secondary | ICD-10-CM | POA: Diagnosis not present

## 2022-04-06 DIAGNOSIS — L820 Inflamed seborrheic keratosis: Secondary | ICD-10-CM | POA: Diagnosis not present

## 2022-04-06 DIAGNOSIS — L821 Other seborrheic keratosis: Secondary | ICD-10-CM | POA: Diagnosis not present

## 2022-04-21 DIAGNOSIS — H2513 Age-related nuclear cataract, bilateral: Secondary | ICD-10-CM | POA: Diagnosis not present

## 2022-04-21 DIAGNOSIS — H524 Presbyopia: Secondary | ICD-10-CM | POA: Diagnosis not present

## 2022-04-21 DIAGNOSIS — Z135 Encounter for screening for eye and ear disorders: Secondary | ICD-10-CM | POA: Diagnosis not present

## 2022-04-27 DIAGNOSIS — K4090 Unilateral inguinal hernia, without obstruction or gangrene, not specified as recurrent: Secondary | ICD-10-CM | POA: Diagnosis not present

## 2022-04-27 DIAGNOSIS — Z1330 Encounter for screening examination for mental health and behavioral disorders, unspecified: Secondary | ICD-10-CM | POA: Diagnosis not present

## 2022-06-15 DIAGNOSIS — K4020 Bilateral inguinal hernia, without obstruction or gangrene, not specified as recurrent: Secondary | ICD-10-CM | POA: Diagnosis not present

## 2022-06-15 DIAGNOSIS — Z79899 Other long term (current) drug therapy: Secondary | ICD-10-CM | POA: Diagnosis not present

## 2022-06-15 DIAGNOSIS — G7000 Myasthenia gravis without (acute) exacerbation: Secondary | ICD-10-CM | POA: Diagnosis not present

## 2022-06-15 DIAGNOSIS — K4090 Unilateral inguinal hernia, without obstruction or gangrene, not specified as recurrent: Secondary | ICD-10-CM | POA: Diagnosis not present

## 2022-06-15 DIAGNOSIS — E669 Obesity, unspecified: Secondary | ICD-10-CM | POA: Diagnosis not present

## 2022-09-06 DIAGNOSIS — R69 Illness, unspecified: Secondary | ICD-10-CM | POA: Diagnosis not present

## 2022-10-06 DIAGNOSIS — K4090 Unilateral inguinal hernia, without obstruction or gangrene, not specified as recurrent: Secondary | ICD-10-CM | POA: Diagnosis not present

## 2022-10-08 DIAGNOSIS — K5730 Diverticulosis of large intestine without perforation or abscess without bleeding: Secondary | ICD-10-CM | POA: Diagnosis not present

## 2022-10-08 DIAGNOSIS — K429 Umbilical hernia without obstruction or gangrene: Secondary | ICD-10-CM | POA: Diagnosis not present

## 2022-10-08 DIAGNOSIS — K4020 Bilateral inguinal hernia, without obstruction or gangrene, not specified as recurrent: Secondary | ICD-10-CM | POA: Diagnosis not present

## 2022-10-08 DIAGNOSIS — K4090 Unilateral inguinal hernia, without obstruction or gangrene, not specified as recurrent: Secondary | ICD-10-CM | POA: Diagnosis not present

## 2022-10-08 DIAGNOSIS — E278 Other specified disorders of adrenal gland: Secondary | ICD-10-CM | POA: Diagnosis not present

## 2022-10-13 DIAGNOSIS — R69 Illness, unspecified: Secondary | ICD-10-CM | POA: Diagnosis not present

## 2022-10-20 DIAGNOSIS — L814 Other melanin hyperpigmentation: Secondary | ICD-10-CM | POA: Diagnosis not present

## 2022-10-20 DIAGNOSIS — D235 Other benign neoplasm of skin of trunk: Secondary | ICD-10-CM | POA: Diagnosis not present

## 2022-10-20 DIAGNOSIS — L579 Skin changes due to chronic exposure to nonionizing radiation, unspecified: Secondary | ICD-10-CM | POA: Diagnosis not present

## 2022-10-20 DIAGNOSIS — D225 Melanocytic nevi of trunk: Secondary | ICD-10-CM | POA: Diagnosis not present

## 2022-10-20 DIAGNOSIS — L821 Other seborrheic keratosis: Secondary | ICD-10-CM | POA: Diagnosis not present

## 2022-10-22 DIAGNOSIS — R69 Illness, unspecified: Secondary | ICD-10-CM | POA: Diagnosis not present

## 2022-10-22 DIAGNOSIS — Z4889 Encounter for other specified surgical aftercare: Secondary | ICD-10-CM | POA: Diagnosis not present

## 2022-11-03 DIAGNOSIS — R69 Illness, unspecified: Secondary | ICD-10-CM | POA: Diagnosis not present

## 2022-11-10 DIAGNOSIS — R69 Illness, unspecified: Secondary | ICD-10-CM | POA: Diagnosis not present

## 2022-11-12 DIAGNOSIS — G7000 Myasthenia gravis without (acute) exacerbation: Secondary | ICD-10-CM | POA: Diagnosis not present

## 2022-11-12 DIAGNOSIS — G518 Other disorders of facial nerve: Secondary | ICD-10-CM | POA: Diagnosis not present

## 2022-11-12 DIAGNOSIS — M5412 Radiculopathy, cervical region: Secondary | ICD-10-CM | POA: Diagnosis not present

## 2022-11-30 DIAGNOSIS — R1909 Other intra-abdominal and pelvic swelling, mass and lump: Secondary | ICD-10-CM | POA: Diagnosis not present

## 2022-12-03 DIAGNOSIS — L237 Allergic contact dermatitis due to plants, except food: Secondary | ICD-10-CM | POA: Diagnosis not present

## 2022-12-08 DIAGNOSIS — R69 Illness, unspecified: Secondary | ICD-10-CM | POA: Diagnosis not present

## 2022-12-15 DIAGNOSIS — R69 Illness, unspecified: Secondary | ICD-10-CM | POA: Diagnosis not present

## 2022-12-17 DIAGNOSIS — Z809 Family history of malignant neoplasm, unspecified: Secondary | ICD-10-CM | POA: Diagnosis not present

## 2022-12-17 DIAGNOSIS — Z79624 Long term (current) use of inhibitors of nucleotide synthesis: Secondary | ICD-10-CM | POA: Diagnosis not present

## 2022-12-17 DIAGNOSIS — Z818 Family history of other mental and behavioral disorders: Secondary | ICD-10-CM | POA: Diagnosis not present

## 2022-12-17 DIAGNOSIS — D84821 Immunodeficiency due to drugs: Secondary | ICD-10-CM | POA: Diagnosis not present

## 2022-12-17 DIAGNOSIS — Z791 Long term (current) use of non-steroidal anti-inflammatories (NSAID): Secondary | ICD-10-CM | POA: Diagnosis not present

## 2022-12-17 DIAGNOSIS — D8481 Immunodeficiency due to conditions classified elsewhere: Secondary | ICD-10-CM | POA: Diagnosis not present

## 2022-12-17 DIAGNOSIS — G7000 Myasthenia gravis without (acute) exacerbation: Secondary | ICD-10-CM | POA: Diagnosis not present

## 2022-12-22 DIAGNOSIS — R69 Illness, unspecified: Secondary | ICD-10-CM | POA: Diagnosis not present

## 2022-12-29 DIAGNOSIS — K4091 Unilateral inguinal hernia, without obstruction or gangrene, recurrent: Secondary | ICD-10-CM | POA: Diagnosis not present

## 2022-12-29 DIAGNOSIS — K4090 Unilateral inguinal hernia, without obstruction or gangrene, not specified as recurrent: Secondary | ICD-10-CM | POA: Diagnosis not present

## 2023-02-11 ENCOUNTER — Encounter: Payer: Self-pay | Admitting: Internal Medicine

## 2023-02-14 IMAGING — DX DG FOOT COMPLETE 3+V*R*
3 series · 3 of 3 positions shown · non-contrast
Comparison: None Available.

CLINICAL DATA: Chronic right heel pain.

EXAM:
RIGHT FOOT COMPLETE - 3+ VIEW

[foot ap wb]
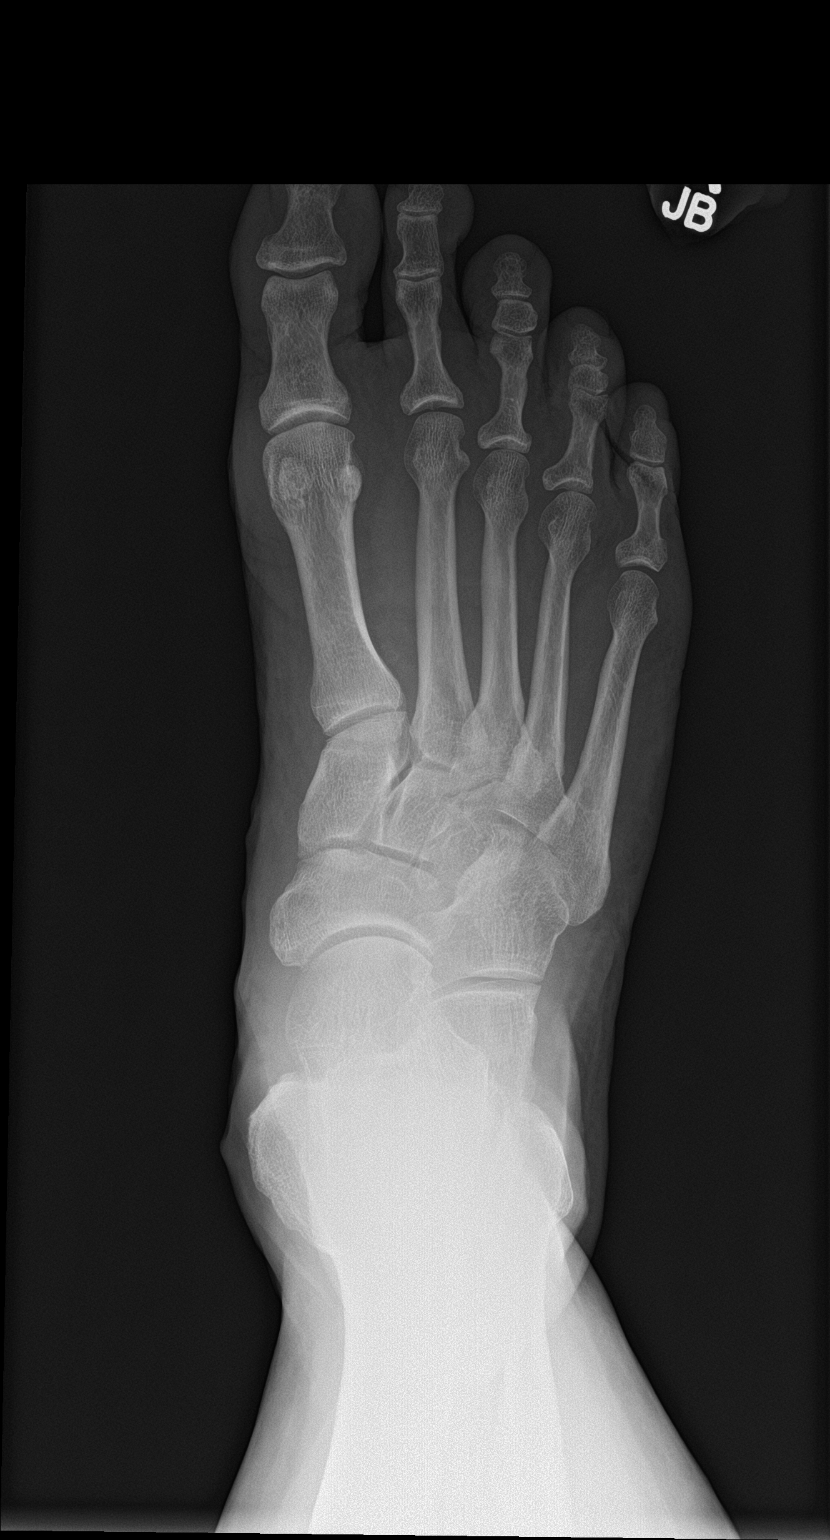

[foot obl wb]
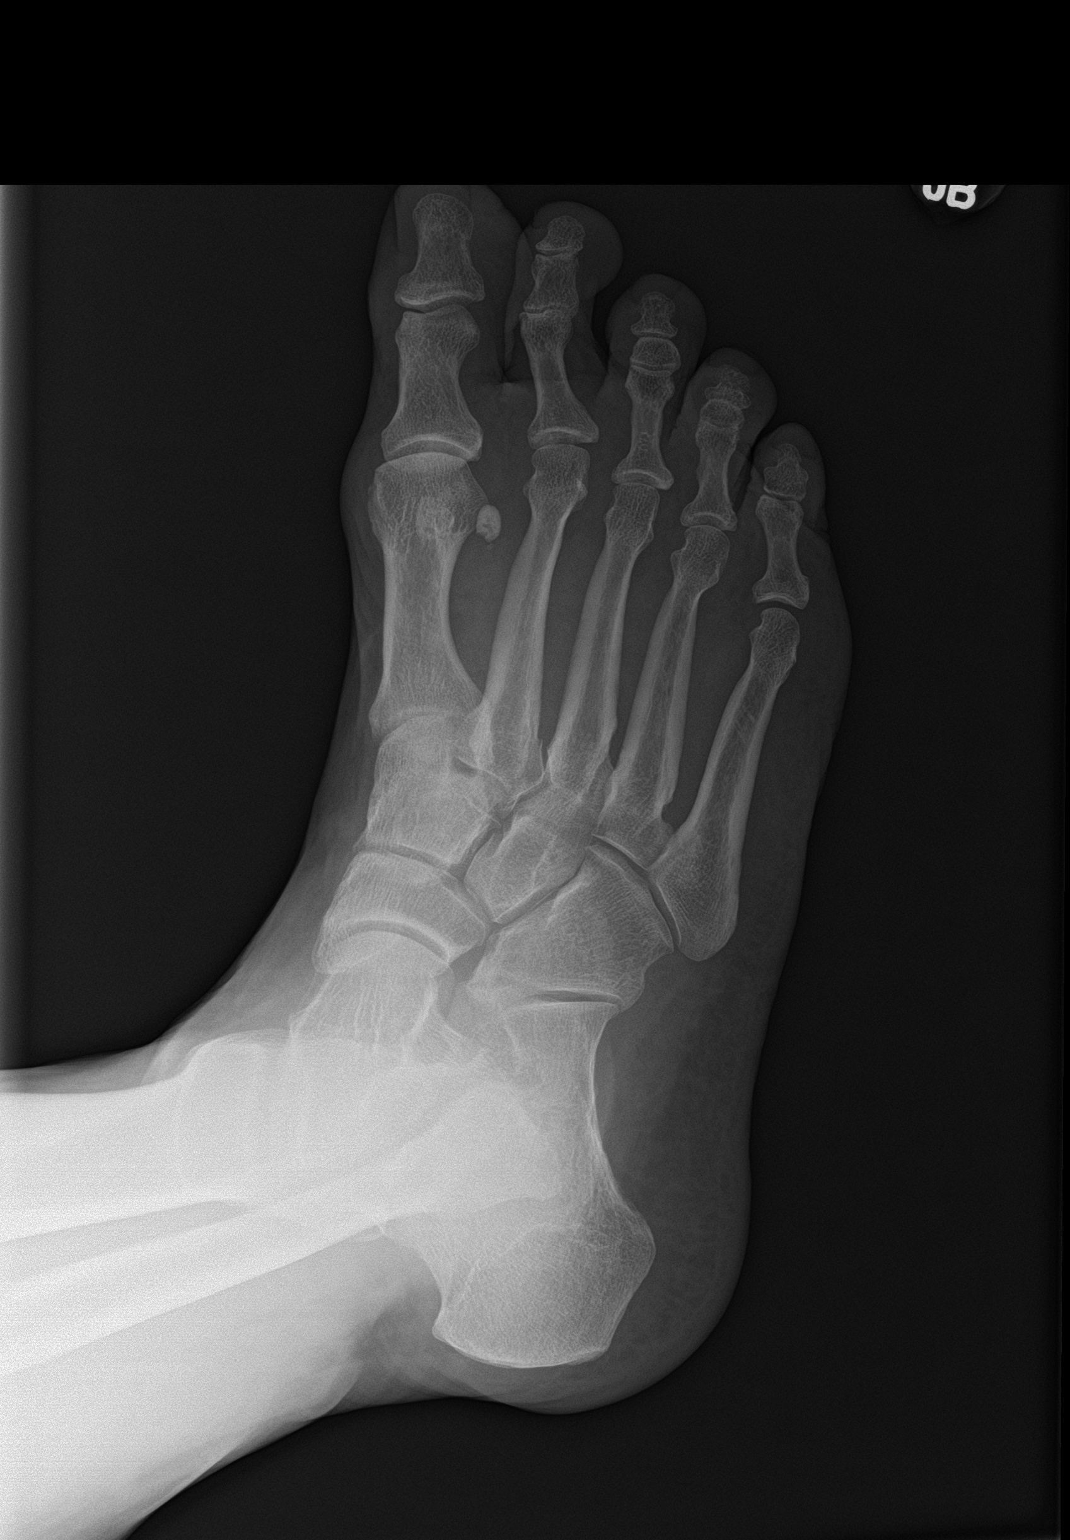

[foot lat wb]
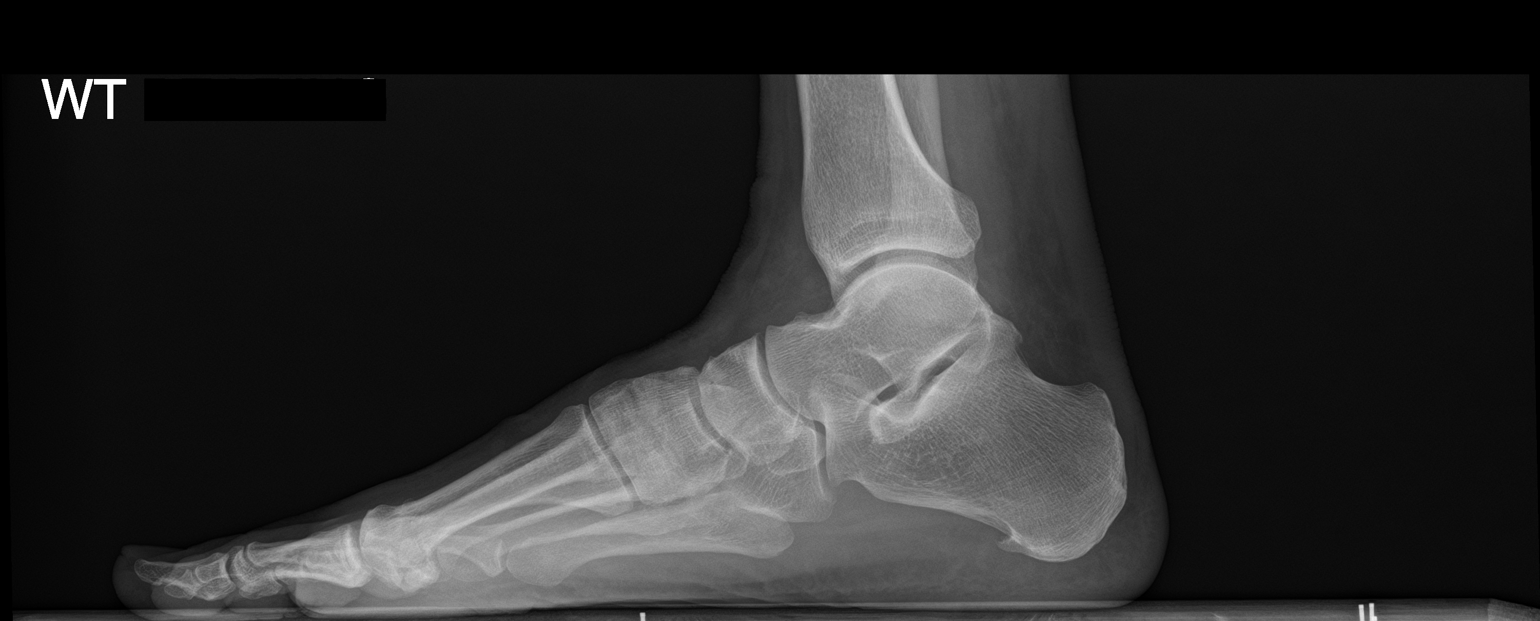

[3 of 3 positions shown; findings below may reference images not displayed]

FINDINGS: There is no evidence of fracture or dislocation. Minimal plantar
calcaneal spur noted. Soft tissues are unremarkable.
IMPRESSION: Minimal plantar calcaneal spur.

## 2023-02-20 DIAGNOSIS — R69 Illness, unspecified: Secondary | ICD-10-CM | POA: Diagnosis not present

## 2023-02-23 DIAGNOSIS — R413 Other amnesia: Secondary | ICD-10-CM | POA: Diagnosis not present

## 2023-02-23 DIAGNOSIS — Z0000 Encounter for general adult medical examination without abnormal findings: Secondary | ICD-10-CM | POA: Diagnosis not present

## 2023-02-23 DIAGNOSIS — E559 Vitamin D deficiency, unspecified: Secondary | ICD-10-CM | POA: Diagnosis not present

## 2023-02-23 DIAGNOSIS — D84821 Immunodeficiency due to drugs: Secondary | ICD-10-CM | POA: Diagnosis not present

## 2023-02-23 DIAGNOSIS — G7000 Myasthenia gravis without (acute) exacerbation: Secondary | ICD-10-CM | POA: Diagnosis not present

## 2023-02-23 DIAGNOSIS — Z79899 Other long term (current) drug therapy: Secondary | ICD-10-CM | POA: Diagnosis not present

## 2023-04-09 ENCOUNTER — Telehealth: Payer: Self-pay | Admitting: Internal Medicine

## 2023-04-09 NOTE — Telephone Encounter (Signed)
Patient called and stated that he was told to fill out a health form and it has a email to [employer] but he was told he did not needed to turn it in. Patient is requesting a call back. Please advise.

## 2023-04-09 NOTE — Telephone Encounter (Signed)
This should be a form that is sent out from scheduling. I do not know about this. Please call him.

## 2023-04-12 ENCOUNTER — Ambulatory Visit (AMBULATORY_SURGERY_CENTER): Payer: Medicare (Managed Care)

## 2023-04-12 VITALS — Ht 66.0 in | Wt 181.0 lb

## 2023-04-12 DIAGNOSIS — Z1211 Encounter for screening for malignant neoplasm of colon: Secondary | ICD-10-CM

## 2023-04-12 MED ORDER — SUFLAVE 178.7 G PO SOLR: 1.0000 | ORAL | 0 refills | Status: DC

## 2023-04-12 NOTE — Progress Notes (Signed)

## 2023-04-13 ENCOUNTER — Telehealth: Payer: Self-pay

## 2023-04-13 NOTE — Telephone Encounter (Signed)
 Marland Kitchen

## 2023-04-19 ENCOUNTER — Telehealth: Payer: Self-pay | Admitting: Internal Medicine

## 2023-04-19 NOTE — Telephone Encounter (Signed)
 RN returned patient call regarding concern about his colon prep. He states that d/t his diagnosis in 2018 of myasthenia gravis, he is not able to use any colon prep which contains magnesium sulfate in it. RN suggested Golytely, reviewing the ingredients with the patient. Patient states he is going to check with his neurologist to see if this is an appropriate medication for him and then he will contact us  again so we can provide appropriate prep instructions.

## 2023-04-19 NOTE — Telephone Encounter (Signed)
 Patient is requesting to speak with someone having further questions about Suflave  medication, patient states he contacted [facility] but would like to speak with someone else stating he has a condition and is concerned.

## 2023-04-19 NOTE — Telephone Encounter (Signed)
 PT called to ask if Suflave  was magnesium sulfate based. Advised him that it is per nurse's advise. Transferred to [facility] to receive a different prep medication.

## 2023-04-28 ENCOUNTER — Encounter: Payer: Self-pay | Admitting: Internal Medicine

## 2023-04-28 NOTE — Telephone Encounter (Signed)
 PT instructed not to take dulcolax and only prep med. Pt stated he understood and no other questions.

## 2023-04-28 NOTE — Telephone Encounter (Signed)
 Inbound call from patient, states he has further questions about Golytely. Would like to speak to a nurse.

## 2023-04-28 NOTE — Telephone Encounter (Signed)
 I did research and while dulcolax is not specifically listed in the literature I reviewed it is ok to omit it for his prep process JMP

## 2023-04-28 NOTE — Telephone Encounter (Signed)
 Pt questioning if he can take the Doculax that is instructed to take with Golytely  due to his Myasthenia Gravis. RN to put TE to MD  and return call to pt with MD reply.

## 2023-04-30 ENCOUNTER — Ambulatory Visit (AMBULATORY_SURGERY_CENTER): Payer: Medicare (Managed Care) | Admitting: Internal Medicine

## 2023-04-30 ENCOUNTER — Encounter: Payer: Self-pay | Admitting: Internal Medicine

## 2023-04-30 VITALS — BP 115/69 | HR 73 | Temp 99.0°F | Resp 16 | Ht 66.0 in | Wt 181.0 lb

## 2023-04-30 DIAGNOSIS — Z1211 Encounter for screening for malignant neoplasm of colon: Secondary | ICD-10-CM | POA: Diagnosis not present

## 2023-04-30 DIAGNOSIS — K5730 Diverticulosis of large intestine without perforation or abscess without bleeding: Secondary | ICD-10-CM

## 2023-04-30 MED ORDER — SODIUM CHLORIDE 0.9 % IV SOLN: 500.0000 mL | Freq: Once | INTRAVENOUS | Status: DC

## 2023-04-30 NOTE — Progress Notes (Signed)
 Pt's states no medical or surgical changes since previsit or office visit.

## 2023-04-30 NOTE — Progress Notes (Signed)
 GASTROENTEROLOGY PROCEDURE H&P NOTE   Primary Care Physician: Wilfred Curtis, MD    Reason for Procedure:   Colon cancer screening  Plan:    colonoscopy  Patient is appropriate for endoscopic procedure(s) in the ambulatory ([facility]) setting.  The nature of the procedure, as well as the risks, benefits, and alternatives were carefully and thoroughly reviewed with the patient. Ample time for discussion and questions allowed. The patient understood, was satisfied, and agreed to proceed.     HPI: Phillip Roberts is a 68 y.o. male who presents for colonoscopy.  Medical history as below.  Tolerated the prep.  No recent chest pain or shortness of breath.  No abdominal pain today.  Past Medical History:  Diagnosis Date   Myasthenia gravis [facility])     Past Surgical History:  Procedure Laterality Date   HERNIA REPAIR     April 2024;October 2024   KNEE SURGERY  1977/1984   LT knee   WISDOM TOOTH EXTRACTION      Prior to Admission medications   Medication Sig Start Date End Date Taking? Authorizing Provider  Ascorbic Acid (VITAMIN C) 500 MG CAPS Take 1 capsule by mouth daily.   Yes [provider]  B COMPLEX VITAMINS PO Take 1 tablet by mouth daily.   Yes [provider]  ELDERBERRY PO Take 1 tablet by mouth daily. 200 mg   Yes [provider]  GARLIC-PARSLEY PO Take 1 capsule by mouth daily. 500mg /100mg    Yes [provider]  mycophenolate (CELLCEPT) 500 MG tablet Take by mouth 2 (two) times daily.   Yes [provider]  naproxen sodium (ALEVE) 220 MG tablet Take 1 tablet by mouth daily as needed.   Yes [provider]  OVER THE COUNTER MEDICATION Take 1 capsule by mouth daily. Fruit and vegetable supplement   Yes [provider]  Vitamin D, Ergocalciferol, (DRISDOL) 1.25 MG (50000 UNIT) CAPS capsule Take 50,000 Units by mouth every 7 (seven) days.   Yes [provider]  zinc gluconate 50 MG tablet Take 50 mg by  mouth daily.   Yes [provider]  doxycycline (VIBRAMYCIN) 100 MG capsule Take 1 capsule (100 mg total) by mouth 2 (two) times daily. 12/05/21   Elson Areas, PA-C    Current Outpatient Medications  Medication Sig Dispense Refill   Ascorbic Acid (VITAMIN C) 500 MG CAPS Take 1 capsule by mouth daily.     B COMPLEX VITAMINS PO Take 1 tablet by mouth daily.     ELDERBERRY PO Take 1 tablet by mouth daily. 200 mg     GARLIC-PARSLEY PO Take 1 capsule by mouth daily. 500mg /100mg      mycophenolate (CELLCEPT) 500 MG tablet Take by mouth 2 (two) times daily.     naproxen sodium (ALEVE) 220 MG tablet Take 1 tablet by mouth daily as needed.     OVER THE COUNTER MEDICATION Take 1 capsule by mouth daily. Fruit and vegetable supplement     Vitamin D, Ergocalciferol, (DRISDOL) 1.25 MG (50000 UNIT) CAPS capsule Take 50,000 Units by mouth every 7 (seven) days.     zinc gluconate 50 MG tablet Take 50 mg by mouth daily.     doxycycline (VIBRAMYCIN) 100 MG capsule Take 1 capsule (100 mg total) by mouth 2 (two) times daily. 20 capsule 0   Current Facility-Administered Medications  Medication Dose Route Frequency Provider Last Rate Last Admin   0.9 %  sodium chloride infusion  500 mL Intravenous Once Gursimran Litaker,  Carie Caddy, MD        Allergies as of 04/30/2023   (No Known Allergies)    Family History  Problem Relation Age of Onset   Breast cancer Mother    Colon cancer Neg Hx    Esophageal cancer Neg Hx    Stomach cancer Neg Hx    Rectal cancer Neg Hx     Social History   Socioeconomic History   Marital status: Married    Spouse name: Not on file   Number of children: Not on file   Years of education: Not on file   Highest education level: Not on file  Occupational History   Not on file  Tobacco Use   Smoking status: Never   Smokeless tobacco: Never  Vaping Use   Vaping status: Never Used  Substance and Sexual Activity   Alcohol use: Yes    Comment: rarely    Drug use: No   Sexual  activity: Not on file  Other Topics Concern   Not on file  Social History Narrative   Not on file   Social Drivers of Health   Financial Resource Strain: Low Risk  (02/20/2023)   Received from [facility]   Overall Financial Resource Strain (CARDIA)    Difficulty of Paying Living Expenses: Not hard at all  Food Insecurity: No Food Insecurity (02/20/2023)   Received from [facility]   Hunger Vital Sign    Worried About Running Out of Food in the Last Year: Never true    Ran Out of Food in the Last Year: Never true  Transportation Needs: No Transportation Needs (02/20/2023)   Received from [facility] - Transportation    Lack of Transportation (Medical): No    Lack of Transportation (Non-Medical): No  Physical Activity: Inactive (02/20/2023)   Received from [facility]   Exercise Vital Sign    Days of Exercise per Week: 0 days    Minutes of Exercise per Session: 10 min  Stress: No Stress Concern Present (02/20/2023)   Received from [facility] - Occupational Stress Questionnaire    Feeling of Stress : Only a little  Social Connections: Somewhat Isolated (02/20/2023)   Received from [facility]   Social Network    How would you rate your social network (family, work, friends)?: Restricted participation with some degree of social isolation  Intimate Partner Violence: Not At Risk (02/20/2023)   Received from [facility]   HITS    Over the last 12 months how often did your partner physically hurt you?: Never    Over the last 12 months how often did your partner insult you or talk down to you?: Never    Over the last 12 months how often did your partner threaten you with physical harm?: Never    Over the last 12 months how often did your partner scream or curse at you?: Never    Physical Exam: Vital signs in last 24 hours: @BP  115/62   Pulse 71   Temp 99 F (37.2 C)   Ht 5\' 6"  (1.676 m)   Wt 181 lb (82.1  kg)   SpO2 97%   BMI 29.21 kg/m  GEN: NAD EYE: Sclerae anicteric ENT: MMM CV: Non-tachycardic Pulm: CTA b/l GI: Soft, NT/ND NEURO:  Alert & Oriented x 3   Erick Blinks, MD [facility]  04/30/2023 1:18 PM

## 2023-04-30 NOTE — Progress Notes (Signed)
 Vss nad trans to pacu

## 2023-04-30 NOTE — Op Note (Signed)
 [facility] Patient Name: Phillip Roberts Procedure Date: 04/30/2023 1:18 PM MRN: [id number] Endoscopist: Beverley Fiedler , MD, [id number] Age: 68 Referring MD:  Date of Birth: [birth year] Gender: Male Account #: [id number] Procedure:                Colonoscopy Indications:              Screening for colorectal malignant neoplasm, Last                            colonoscopy 10 years ago Medicines:                Monitored Anesthesia Care Procedure:                Pre-Anesthesia Assessment:                           - Prior to the procedure, a History and Physical                            was performed, and patient medications and                            allergies were reviewed. The patient's tolerance of                            previous anesthesia was also reviewed. The risks                            and benefits of the procedure and the sedation                            options and risks were discussed with the patient.                            All questions were answered, and informed consent                            was obtained. Prior Anticoagulants: The patient has                            taken no anticoagulant or antiplatelet agents. ASA                            Grade Assessment: II - A patient with mild systemic                            disease. After reviewing the risks and benefits,                            the patient was deemed in satisfactory condition to                            undergo the procedure.  After obtaining informed consent, the colonoscope                            was passed under direct vision. Throughout the                            procedure, the patient's blood pressure, pulse, and                            oxygen saturations were monitored continuously. The                            [id number]-HQ190L Colonoscope [id number] was introduced                            through the anus and advanced to the  cecum,                            identified by transillumination. The colonoscopy                            was performed without difficulty. The patient                            tolerated the procedure well. The quality of the                            bowel preparation was good. The ileocecal valve,                            appendiceal orifice, and rectum were photographed. Scope In: 1:23:23 PM Scope Out: 1:35:38 PM Scope Withdrawal Time: 0 hours 9 minutes 59 seconds  Total Procedure Duration: 0 hours 12 minutes 15 seconds  Findings:                 The digital rectal exam was normal.                           A few small-mouthed diverticula were found in the                            descending colon.                           The exam was otherwise without abnormality on                            direct and retroflexion views. Complications:            No immediate complications. Estimated Blood Loss:     Estimated blood loss: none. Impression:               - Mild diverticulosis in the descending colon.                           - The examination was otherwise normal  on direct                            and retroflexion views.                           - No specimens collected. Recommendation:           - Patient has a contact number available for                            emergencies. The signs and symptoms of potential                            delayed complications were discussed with the                            patient. Return to normal activities tomorrow.                            Written discharge instructions were provided to the                            patient.                           - Resume previous diet.                           - Continue present medications.                           - Repeat colonoscopy in 10 years for screening                            purposes can be discussed at that time. Beverley Fiedler, MD 04/30/2023 1:37:53 PM This  report has been signed electronically.

## 2023-04-30 NOTE — Patient Instructions (Signed)
 Resume previous medications and diet.  Handout provided on diverticulosis.  Repeat colonoscopy in 10 years for screening purposes.    YOU HAD AN ENDOSCOPIC PROCEDURE TODAY AT THE  ENDOSCOPY CENTER:   Refer to the procedure report that was given to you for any specific questions about what was found during the examination.  If the procedure report does not answer your questions, please call your gastroenterologist to clarify.  If you requested that your care partner not be given the details of your procedure findings, then the procedure report has been included in a sealed envelope for you to review at your convenience later.  YOU SHOULD EXPECT: Some feelings of bloating in the abdomen. Passage of more gas than usual.  Walking can help get rid of the air that was put into your GI tract during the procedure and reduce the bloating. If you had a lower endoscopy (such as a colonoscopy or flexible sigmoidoscopy) you may notice spotting of blood in your stool or on the toilet paper. If you underwent a bowel prep for your procedure, you may not have a normal bowel movement for a few days.  Please Note:  You might notice some irritation and congestion in your nose or some drainage.  This is from the oxygen used during your procedure.  There is no need for concern and it should clear up in a day or so.  SYMPTOMS TO REPORT IMMEDIATELY:  Following lower endoscopy (colonoscopy or flexible sigmoidoscopy):  Excessive amounts of blood in the stool  Significant tenderness or worsening of abdominal pains  Swelling of the abdomen that is new, acute  Fever of 100F or higher  For urgent or emergent issues, a gastroenterologist can be reached at any hour by calling [phone]. Do not use MyChart messaging for urgent concerns.    DIET:  We do recommend a small meal at first, but then you may proceed to your regular diet.  Drink plenty of fluids but you should avoid alcoholic beverages for 24  hours.  ACTIVITY:  You should plan to take it easy for the rest of today and you should NOT DRIVE or use heavy machinery until tomorrow (because of the sedation medicines used during the test).    FOLLOW UP: Our staff will call the number listed on your records the next business day following your procedure.  We will call around 7:15- 8:00 am to check on you and address any questions or concerns that you may have regarding the information given to you following your procedure. If we do not reach you, we will leave a message.     If any biopsies were taken you will be contacted by phone or by letter within the next 1-3 weeks.  Please call us at [phone] if you have not heard about the biopsies in 3 weeks.    SIGNATURES/CONFIDENTIALITY: You and/or your care partner have signed paperwork which will be entered into your electronic medical record.  These signatures attest to the fact that that the information above on your After Visit Summary has been reviewed and is understood.  Full responsibility of the confidentiality of this discharge information lies with you and/or your care-partner.

## 2023-05-03 ENCOUNTER — Telehealth: Payer: Self-pay | Admitting: *Deleted

## 2023-05-03 NOTE — Telephone Encounter (Signed)
 Attempted post procedure follow up call.  No answer - LVM.
